# Patient Record
Sex: Female | Born: 1970 | Race: Black or African American | Hispanic: No | Marital: Single | Smoking: Never smoker
Health system: Southern US, Community
[De-identification: ages and names within clinical notes are randomized; demographics above are authoritative.]

## PROBLEM LIST (undated history)

## (undated) DIAGNOSIS — N390 Urinary tract infection, site not specified: Secondary | ICD-10-CM

## (undated) DIAGNOSIS — E785 Hyperlipidemia, unspecified: Secondary | ICD-10-CM

## (undated) DIAGNOSIS — R87619 Unspecified abnormal cytological findings in specimens from cervix uteri: Secondary | ICD-10-CM

## (undated) DIAGNOSIS — D219 Benign neoplasm of connective and other soft tissue, unspecified: Secondary | ICD-10-CM

## (undated) DIAGNOSIS — IMO0002 Reserved for concepts with insufficient information to code with codable children: Secondary | ICD-10-CM

## (undated) HISTORY — PX: COLPOSCOPY: SHX161

## (undated) HISTORY — PX: ESSURE TUBAL LIGATION: SUR464

## (undated) HISTORY — DX: Benign neoplasm of connective and other soft tissue, unspecified: D21.9

## (undated) HISTORY — PX: LEEP: SHX91

---

## 2012-05-16 ENCOUNTER — Inpatient Hospital Stay (HOSPITAL_COMMUNITY): Payer: Self-pay

## 2012-05-16 ENCOUNTER — Inpatient Hospital Stay (HOSPITAL_COMMUNITY)
Admission: AD | Admit: 2012-05-16 | Discharge: 2012-05-16 | Disposition: A | Discharge status: Home / Self Care | Payer: Self-pay | Source: Ambulatory Visit | Attending: Obstetrics & Gynecology | Admitting: Obstetrics & Gynecology

## 2012-05-16 ENCOUNTER — Encounter (HOSPITAL_COMMUNITY): Payer: Self-pay

## 2012-05-16 DIAGNOSIS — D649 Anemia, unspecified: Secondary | ICD-10-CM | POA: Insufficient documentation

## 2012-05-16 DIAGNOSIS — N644 Mastodynia: Secondary | ICD-10-CM | POA: Insufficient documentation

## 2012-05-16 DIAGNOSIS — N63 Unspecified lump in unspecified breast: Secondary | ICD-10-CM | POA: Insufficient documentation

## 2012-05-16 DIAGNOSIS — N6323 Unspecified lump in the left breast, lower outer quadrant: Secondary | ICD-10-CM

## 2012-05-16 DIAGNOSIS — N949 Unspecified condition associated with female genital organs and menstrual cycle: Secondary | ICD-10-CM | POA: Insufficient documentation

## 2012-05-16 DIAGNOSIS — N920 Excessive and frequent menstruation with regular cycle: Secondary | ICD-10-CM | POA: Insufficient documentation

## 2012-05-16 DIAGNOSIS — D259 Leiomyoma of uterus, unspecified: Secondary | ICD-10-CM | POA: Insufficient documentation

## 2012-05-16 DIAGNOSIS — D250 Submucous leiomyoma of uterus: Secondary | ICD-10-CM

## 2012-05-16 HISTORY — DX: Urinary tract infection, site not specified: N39.0

## 2012-05-16 HISTORY — DX: Unspecified abnormal cytological findings in specimens from cervix uteri: R87.619

## 2012-05-16 HISTORY — DX: Reserved for concepts with insufficient information to code with codable children: IMO0002

## 2012-05-16 LAB — CBC
HCT: 27.9 % — ABNORMAL LOW (ref 36.0–46.0)
MCH: 21.2 pg — ABNORMAL LOW (ref 26.0–34.0)
MCV: 73 fL — ABNORMAL LOW (ref 78.0–100.0)
Platelets: 319 10*3/uL (ref 150–400)
RBC: 3.82 MIL/uL — ABNORMAL LOW (ref 3.87–5.11)
WBC: 6.1 10*3/uL (ref 4.0–10.5)

## 2012-05-16 LAB — POCT PREGNANCY, URINE: Preg Test, Ur: NEGATIVE

## 2012-05-16 LAB — WET PREP, GENITAL
Trich, Wet Prep: NONE SEEN
Yeast Wet Prep HPF POC: NONE SEEN

## 2012-05-16 NOTE — MAU Provider Note (Signed)
Chief Complaint: Metrorrhagia and Breast Pain   First Provider Initiated Contact with Patient 05/16/12 1935     SUBJECTIVE HPI: Diania Co is a 41 y.o. who presents to maternity admissions reporting heavy regular menses, vaginal discharge with mucous, and pain under her left breast  She reports a strong family history of breast and cervical cancer, and of uterine fibroids.  She denies current vaginal bleeding, vaginal itching/burning, urinary symptoms, h/a, dizziness, n/v, or fever/chills.  .   No past medical history on file. No past surgical history on file. History   Social History  . Marital Status: Single    Spouse Name: N/A    Number of Children: N/A  . Years of Education: N/A   Occupational History  . Not on file.   Social History Main Topics  . Smoking status: Not on file  . Smokeless tobacco: Not on file  . Alcohol Use: Not on file  . Drug Use: Not on file  . Sexually Active: Not on file   Other Topics Concern  . Not on file   Social History Narrative  . No narrative on file   No current facility-administered medications on file prior to encounter.   No current outpatient prescriptions on file prior to encounter.   Allergies not on file  ROS: Pertinent items in HPI  OBJECTIVE Blood pressure 119/74, pulse 82, temperature 97.8 F (36.6 C), temperature source Oral, resp. rate 18, height 5\' 1"  (1.549 m), weight 69.854 kg (154 lb), last menstrual period 05/01/2012. GENERAL: Well-developed, well-nourished female in no acute distress.  HEENT: Normocephalic HEART: normal rate RESP: normal effort ABDOMEN: Soft, non-tender EXTREMITIES: Nontender, no edema NEURO: Alert and oriented Pelvic exam: Cervix pink, visually closed, without lesion, moderate amount thin yellow discharge, vaginal walls and external genitalia normal Bimanual exam: Cervix 0/long/high, firm, anterior, neg CMT, uterus nontender, enlarged ~10 week size, adnexa without tenderness, enlargement,  or mass  LAB RESULTS  Results for orders placed during the hospital encounter of 05/16/12 (from the past 24 hour(s))  POCT PREGNANCY, URINE     Status: Normal   Collection Time   05/16/12  7:09 PM      Component Value Range   Preg Test, Ur NEGATIVE  NEGATIVE  WET PREP, GENITAL     Status: Abnormal   Collection Time   05/16/12  7:49 PM      Component Value Range   Yeast Wet Prep HPF POC NONE SEEN  NONE SEEN   Trich, Wet Prep NONE SEEN  NONE SEEN   Clue Cells Wet Prep HPF POC FEW (*) NONE SEEN   WBC, Wet Prep HPF POC FEW (*) NONE SEEN  CBC     Status: Abnormal   Collection Time   05/16/12  8:10 PM      Component Value Range   WBC 6.1  4.0 - 10.5 K/uL   RBC 3.82 (*) 3.87 - 5.11 MIL/uL   Hemoglobin 8.1 (*) 12.0 - 15.0 g/dL   HCT 95.6 (*) 21.3 - 08.6 %   MCV 73.0 (*) 78.0 - 100.0 fL   MCH 21.2 (*) 26.0 - 34.0 pg   MCHC 29.0 (*) 30.0 - 36.0 g/dL   RDW 57.8 (*) 46.9 - 62.9 %   Platelets 319  150 - 400 K/uL    Clinical Data: Heavy menses and enlarged uterus. LMP 04/29/2012.  TRANSABDOMINAL AND TRANSVAGINAL ULTRASOUND OF PELVIS  Technique: Both transabdominal and transvaginal ultrasound  examinations of the pelvis were performed. Transabdominal technique  was  performed for global imaging of the pelvis including uterus,  ovaries, adnexal regions, and pelvic cul-de-sac.  It was necessary to proceed with endovaginal exam following the  transabdominal exam to visualize the uterus and ovaries.  Comparison: None  Findings:  Uterus: The uterus is anteverted and measures 9.4 x 6.6 x 6.8 cm.  There are multiple uterine fibroids. A submucosal fibroid in the  anterior upper uterine body measures 3.1 x 2.7 x 2.4 cm. A  submucosal fibroid in the right upper uterine body measures 2.5 x  2.2 x 2.5 cm. Intramural fibroid in the right uterine body  measures 2.8 x 3.0 x 3.3 cm. Intramural right lower uterine  segment fibroid measures 1.3 x 1.2 x 1.7 cm. Intramural left  fundal fibroid measures  1.5 x 1.2 x 1.4 cm.  Endometrium: Some portions of the the endometrium are obscured by  the fibroids. Where visualized the endometrium measures 7 mm in  thickness.  Right ovary: Normal appearance/no adnexal mass. Measures 4.3 x  2.7 x 2.3 cm.  Left ovary: Normal appearance/no adnexal mass. Measures 2.8 x 1.8  x 1.7 cm.  Other findings: No free fluid  IMPRESSION:  1.Enlarged fibroid uterus. Fibroids are described above. Two of  the fibroids are submucosal in position.  2. Ovaries within normal limits.    ASSESSMENT Uterine fibroids Anemia Heavy vaginal bleeding Breast mass  PLAN Outpatient mammogram Pt to go to Gyn clinic to fill out scholarship paperwork Message sent to GYN clinic to schedule an appointment for follow up as HGB is 8 today. Advised to take an over the counter iron supplement and stool softener if needed. Discharge home  Report given to Nolene Bernheim, NP, at 2010  Lisa Leftwich-Kirby Certified Nurse-Midwife 05/16/2012  7:52 PM

## 2012-05-16 NOTE — MAU Note (Signed)
Pain under left breast for 'some months'.  Pat gm had breast cancer. Been having heavy cycles for past 6 months. Heavier and lasting longer. This wk noted blood and mucous for a couple days when wiped,  Has hx of cervical dysplasia.

## 2012-05-17 ENCOUNTER — Other Ambulatory Visit (HOSPITAL_COMMUNITY): Payer: Self-pay | Admitting: Advanced Practice Midwife

## 2012-05-17 DIAGNOSIS — N6323 Unspecified lump in the left breast, lower outer quadrant: Secondary | ICD-10-CM

## 2012-05-17 LAB — GC/CHLAMYDIA PROBE AMP: CT Probe RNA: NEGATIVE

## 2012-05-30 ENCOUNTER — Encounter: Payer: Self-pay | Admitting: Family

## 2012-06-05 ENCOUNTER — Encounter (HOSPITAL_COMMUNITY): Payer: Self-pay | Admitting: *Deleted

## 2012-06-13 ENCOUNTER — Telehealth: Payer: Self-pay

## 2012-06-13 NOTE — Telephone Encounter (Signed)
Pt called and asked if she needed to cancel appt due to her being on her period.   Called pt and informed pt that she does not need to reschedule appt due to menstruation and by that time it should have slowed done since she would have been on period since 06/06/11.  Pt stated "yeah, ok".  Pt did not have any other questions.

## 2012-06-18 ENCOUNTER — Ambulatory Visit (HOSPITAL_COMMUNITY)
Admission: RE | Admit: 2012-06-18 | Discharge: 2012-06-18 | Disposition: A | Discharge status: Home / Self Care | Payer: Self-pay | Source: Ambulatory Visit | Attending: Obstetrics and Gynecology | Admitting: Obstetrics and Gynecology

## 2012-06-18 ENCOUNTER — Encounter (HOSPITAL_COMMUNITY): Payer: Self-pay

## 2012-06-18 ENCOUNTER — Other Ambulatory Visit: Payer: Self-pay | Admitting: Obstetrics and Gynecology

## 2012-06-18 VITALS — BP 102/64 | Temp 98.6°F | Ht 61.0 in | Wt 151.2 lb

## 2012-06-18 DIAGNOSIS — Z01419 Encounter for gynecological examination (general) (routine) without abnormal findings: Secondary | ICD-10-CM

## 2012-06-18 DIAGNOSIS — N6323 Unspecified lump in the left breast, lower outer quadrant: Secondary | ICD-10-CM

## 2012-06-18 HISTORY — DX: Hyperlipidemia, unspecified: E78.5

## 2012-06-18 NOTE — Patient Instructions (Signed)
Taught patient how to perform BSE and gave educational materials to take home. Told patient about free cervical cancer screenings to receive a Pap smear prior to 3 years. Let her know [facility] will cover Pap smears every 3 years unless has a history of abnormal Pap smears. Referred patient to the [facility] for diagnostic mammogram and possible left breast ultrasound. Appointment scheduled for Thursday, June 27, 2012 at 0900. Let patient know they may need her previous mammogram films from [place] that it would be helpful to call them and have them sent to the [facility]. Patient aware of appointment and will be there. Let patient know will follow up with her within the next couple weeks with results by letter or phone. Patient already has an appointment scheduled at the [facility] for fibroids. Appointment Thursday, June 20, 2012. Patient verbalized understanding.

## 2012-06-18 NOTE — Progress Notes (Signed)
Complaints of left breast lump x 2 months that is painful when touches it and patient stated she also feels some shooting pains daily. Patient rated pain at a 6 out of 10 when touches.  Pap Smear:  Completed Pap smear today. Per patient last Pap smear was in 2009 and normal. Per patient had an abnormal Pap smear in 1991 that required a LEEP for follow up in 1992. Patient stated has not had any abnormal Pap smears since LEEP.  No Pap smear results in EPIC.  Physical exam: Breasts Breasts symmetrical. No skin abnormalities bilateral breasts. No nipple retraction bilateral breasts. No nipple discharge bilateral breasts. No lymphadenopathy. No lumps palpated right breast. Palpated lump within the left breast at 5 o'clock 5 cm from the areola. Patient complained of pain and tenderness when palpated left breast lump. Referred patient to the [facility] for diagnostic mammogram and possible left breast ultrasound. Appointment scheduled for Thursday, June 27, 2012 at 0900.         Pelvic/Bimanual   Ext Genitalia No lesions, no swelling and no discharge observed on external genitalia.         Vagina Vagina pink and normal texture. No lesions or discharge observed in vagina.          Cervix Cervix is present. Cervix pink and of normal texture. No discharge observed on cervix.      Uterus Uterus is present and palpable. Uterus is titled to the left and enlarged. Patient stated has uterine fibroids and has a follow up appointment at the [facility] on Thursday, June 20, 2012.      Adnexae Bilateral ovaries present and palpable. No tenderness on palpation.        Rectovaginal No rectal exam completed today since patient had no rectal complaints. No skin abnormalities observed on rectal area.

## 2012-06-20 ENCOUNTER — Ambulatory Visit (INDEPENDENT_AMBULATORY_CARE_PROVIDER_SITE_OTHER): Payer: Self-pay | Admitting: Medical

## 2012-06-20 ENCOUNTER — Encounter: Payer: Self-pay | Admitting: Medical

## 2012-06-20 VITALS — BP 103/70 | HR 80 | Temp 98.8°F | Ht 61.0 in | Wt 152.6 lb

## 2012-06-20 DIAGNOSIS — D259 Leiomyoma of uterus, unspecified: Secondary | ICD-10-CM

## 2012-06-20 DIAGNOSIS — D219 Benign neoplasm of connective and other soft tissue, unspecified: Secondary | ICD-10-CM

## 2012-06-20 DIAGNOSIS — N882 Stricture and stenosis of cervix uteri: Secondary | ICD-10-CM

## 2012-06-20 DIAGNOSIS — N938 Other specified abnormal uterine and vaginal bleeding: Secondary | ICD-10-CM

## 2012-06-20 DIAGNOSIS — Z01812 Encounter for preprocedural laboratory examination: Secondary | ICD-10-CM

## 2012-06-20 DIAGNOSIS — N949 Unspecified condition associated with female genital organs and menstrual cycle: Secondary | ICD-10-CM

## 2012-06-21 ENCOUNTER — Encounter: Payer: Self-pay | Admitting: Medical

## 2012-06-21 DIAGNOSIS — N938 Other specified abnormal uterine and vaginal bleeding: Secondary | ICD-10-CM | POA: Insufficient documentation

## 2012-06-21 DIAGNOSIS — D219 Benign neoplasm of connective and other soft tissue, unspecified: Secondary | ICD-10-CM | POA: Insufficient documentation

## 2012-06-21 MED ORDER — MISOPROSTOL 200 MCG PO TABS: 400.0000 ug | ORAL_TABLET | Freq: Once | ORAL | Status: AC

## 2012-06-21 NOTE — Progress Notes (Signed)
Patient ID: Amber Carlson, female   DOB: June 01, 1970, 42 y.o.   MRN: [id number]  History:  Amber Carlson is a 42 y.o. [id number] who presents to clinic today for evaluation of dysfunctional uterine bleeding. The patient was seen in MAU on 05/16/12 and had an US showing multiple fibroids the largest of which measures 2.8 x 3.0 x 3.3 cm. The patient states that for the past few months she has been bleeding, on average, 10-15 days each month. This past month she bled for 21 days. She is only spotting today. Her last episode of bleeding finished on 06/17/12. The patient states that when she has been bleeding these past few months it has been very heavy bleeding. She is occasionally passing clots. They are about 1-2 cm in size but she has passed some as large as her hand in the past few months. The patient has had some occasional dizziness, weakness and fatigue. She denies lightheadedness. She has never been prescribed any medications to stop her bleeding.  The patient is currently taking PO iron and a stool softener daily.   The following portions of the patient's history were reviewed and updated as appropriate: allergies, current medications, past family history, past medical history, past social history, past surgical history and problem list.  Review of Systems:  Pertinent items are noted in HPI.  Objective:  Physical Exam BP 103/70  Pulse 80  Temp 98.8 F (37.1 C) (Oral)  Ht 5\' 1"  (1.549 m)  Wt 152 lb 9.6 oz (69.219 kg)  BMI 28.83 kg/m2  LMP 06/10/2011 GENERAL: Well-developed, well-nourished female in no acute distress.  HEENT: Normocephalic, atraumatic.  LUNGS: Normal rate.  HEART: Regular rate. ABDOMEN: Soft, nontender, nondistended.  PELVIC: Normal external female genitalia. Vagina is pink and rugated.  Normal discharge. Normal cervix contour. Uterus slightly enlarged. No adnexal mass or tenderness.  EXTREMITIES: No cyanosis, clubbing, or edema.  Labs and Imaging *RADIOLOGY REPORT*     Clinical Data: Heavy menses and enlarged uterus. LMP 04/29/2012.   TRANSABDOMINAL AND TRANSVAGINAL ULTRASOUND OF PELVIS   Technique: Both transabdominal and transvaginal ultrasound  examinations of the pelvis were performed. Transabdominal technique  was performed for global imaging of the pelvis including uterus,  ovaries, adnexal regions, and pelvic cul-de-sac.  It was necessary to proceed with endovaginal exam following the  transabdominal exam to visualize the uterus and ovaries.   Comparison: None   Findings:  Uterus: The uterus is anteverted and measures 9.4 x 6.6 x 6.8 cm.  There are multiple uterine fibroids. A submucosal fibroid in the  anterior upper uterine body measures 3.1 x 2.7 x 2.4 cm. A  submucosal fibroid in the right upper uterine body measures 2.5 x  2.2 x 2.5 cm. Intramural fibroid in the right uterine body  measures 2.8 x 3.0 x 3.3 cm. Intramural right lower uterine  segment fibroid measures 1.3 x 1.2 x 1.7 cm. Intramural left  fundal fibroid measures 1.5 x 1.2 x 1.4 cm.   Endometrium: Some portions of the the endometrium are obscured by  the fibroids. Where visualized the endometrium measures 7 mm in  thickness.   Right ovary: Normal appearance/no adnexal mass. Measures 4.3 x  2.7 x 2.3 cm.   Left ovary: Normal appearance/no adnexal mass. Measures 2.8 x 1.8  x 1.7 cm.   Other findings: No free fluid   IMPRESSION:  1.Enlarged fibroid uterus. Fibroids are described above. Two of  the fibroids are submucosal in position.  2. Ovaries within normal limits.  Original Report Authenticated By: Britta Mccreedy, M.D.  Procedures: ENDOMETRIAL BIOPSY     The indications for endometrial biopsy were reviewed.   Risks of the biopsy including cramping, bleeding, infection, uterine perforation, inadequate specimen and need for additional procedures  were discussed. The patient states she understands and agrees to undergo procedure today. Consent was signed. Time  out was performed. Urine HCG was negative. A sterile speculum was placed in the patient's vagina and the cervix was prepped with Betadine. A single-toothed tenaculum was placed on the anterior lip of the cervix to stabilize it. I attempted to introduce the 3 mm pipelle into the endometrial cavity but was unable to pass through the cervix due to stenosis. Attempt was made to dilate the cervix using the yellow plastic dilator, but this was also unsuccessful. Dr. Jolayne Panther also attempted and was unable to pass through the cervix. The instruments were removed from the patient's vagina. No bleeding from the cervix was noted. The patient will return in the next 1-2 weeks for re-attempt. The patient was prescribed cytotec to be placed the night before the procedure to attempt to soften and dilate the cervix.   Assessment & Plan:  Assessment: Fibroid Uterus Anemia DUB  Plans: Continue on PO iron Patient will return in 1-2 weeks for re-attempt of endometrial biopsy Patient will also need a CBC at that visit Rx for cytotec sent to patient's pharmacy to be placed the night before the procedure Following the endometrial biopsy the patient will be scheduled with a surgeon for follow-up, as surgical intervention may be a possibility to treat her fibroids Patient is encourage to call the office if her condition were to change or worsen or return to MAU for evaluation.  Freddi Starr, PA-C 06/21/2012 9:12 AM

## 2012-06-21 NOTE — Patient Instructions (Addendum)
Fibroids Fibroids are lumps (tumors) that can occur any place in a woman's body. These lumps are not cancerous. Fibroids vary in size, weight, and where they grow. HOME CARE  Do not take aspirin.  Write down the number of pads or tampons you use during your period. Tell your doctor. This can help determine the best treatment for you. GET HELP RIGHT AWAY IF:  You have pain in your lower belly (abdomen) that is not helped with medicine.  You have cramps that are not helped with medicine.  You have more bleeding between or during your period.  You feel lightheaded or pass out (faint).  Your lower belly pain gets worse. MAKE SURE YOU:  Understand these instructions.  Will watch your condition.  Will get help right away if you are not doing well or get worse. Document Released: 06/17/2010 Document Revised: 08/07/2011 Document Reviewed: 06/17/2010 [employer] Patient Information 2013 [employer].  

## 2012-06-26 ENCOUNTER — Encounter: Payer: Self-pay | Admitting: *Deleted

## 2012-06-27 ENCOUNTER — Ambulatory Visit
Admission: RE | Admit: 2012-06-27 | Discharge: 2012-06-27 | Disposition: A | Discharge status: Home / Self Care | Payer: No Typology Code available for payment source | Source: Ambulatory Visit | Attending: Advanced Practice Midwife | Admitting: Advanced Practice Midwife

## 2012-06-27 ENCOUNTER — Ambulatory Visit
Admission: RE | Admit: 2012-06-27 | Discharge: 2012-06-27 | Disposition: A | Discharge status: Home / Self Care | Payer: No Typology Code available for payment source | Source: Ambulatory Visit | Attending: Obstetrics and Gynecology | Admitting: Obstetrics and Gynecology

## 2012-06-27 DIAGNOSIS — N6323 Unspecified lump in the left breast, lower outer quadrant: Secondary | ICD-10-CM

## 2012-07-05 ENCOUNTER — Ambulatory Visit (INDEPENDENT_AMBULATORY_CARE_PROVIDER_SITE_OTHER): Payer: Self-pay | Admitting: Medical

## 2012-07-05 ENCOUNTER — Encounter: Payer: Self-pay | Admitting: Medical

## 2012-07-05 ENCOUNTER — Other Ambulatory Visit (HOSPITAL_COMMUNITY)
Admission: RE | Admit: 2012-07-05 | Discharge: 2012-07-05 | Disposition: A | Discharge status: Home / Self Care | Payer: Self-pay | Source: Ambulatory Visit | Attending: Medical | Admitting: Medical

## 2012-07-05 VITALS — BP 133/80 | HR 79 | Temp 97.2°F | Ht 61.0 in | Wt 151.0 lb

## 2012-07-05 DIAGNOSIS — N949 Unspecified condition associated with female genital organs and menstrual cycle: Secondary | ICD-10-CM | POA: Insufficient documentation

## 2012-07-05 DIAGNOSIS — N938 Other specified abnormal uterine and vaginal bleeding: Secondary | ICD-10-CM | POA: Insufficient documentation

## 2012-07-05 LAB — CBC
Hemoglobin: 10.8 g/dL — ABNORMAL LOW (ref 12.0–15.0)
MCH: 25.2 pg — ABNORMAL LOW (ref 26.0–34.0)
MCHC: 30.9 g/dL (ref 30.0–36.0)
MCV: 81.6 fL (ref 78.0–100.0)
RBC: 4.29 MIL/uL (ref 3.87–5.11)

## 2012-07-05 LAB — POCT PREGNANCY, URINE: Preg Test, Ur: NEGATIVE

## 2012-07-05 MED ORDER — DICLOFENAC SODIUM 75 MG PO TBEC: 75.0000 mg | DELAYED_RELEASE_TABLET | Freq: Two times a day (BID) | ORAL | Status: AC

## 2012-07-05 NOTE — Patient Instructions (Addendum)
Fibroids Fibroids are lumps (tumors) that can occur any place in a woman's body. These lumps are not cancerous. Fibroids vary in size, weight, and where they grow. HOME CARE  Do not take aspirin.  Write down the number of pads or tampons you use during your period. Tell your doctor. This can help determine the best treatment for you. GET HELP RIGHT AWAY IF:  You have pain in your lower belly (abdomen) that is not helped with medicine.  You have cramps that are not helped with medicine.  You have more bleeding between or during your period.  You feel lightheaded or pass out (faint).  Your lower belly pain gets worse. MAKE SURE YOU:  Understand these instructions.  Will watch your condition.  Will get help right away if you are not doing well or get worse. Document Released: 06/17/2010 Document Revised: 08/07/2011 Document Reviewed: 06/17/2010 [facility] Patient Information 2013 [place], [place]. Endometrial Biopsy This is a test in which a tissue sample (a biopsy) is taken from inside the uterus (womb). It is then looked at by a specialist under a microscope to see if the tissue is normal or abnormal. The endometrium is the lining of the uterus. This test helps determine where you are in your menstrual cycle and how hormone levels are affecting the lining of the uterus. Another use for this test is to diagnose endometrial cancer, tuberculosis, polyps, or inflammatory conditions and to evaluate uterine bleeding. PREPARATION FOR TEST No preparation or fasting is necessary. NORMAL FINDINGS No pathologic conditions. Presence of "secretory-type" endometrium 3 to 5 days before to normal menstruation. Ranges for normal findings may vary among different laboratories and hospitals. You should always check with your doctor after having lab work or other tests done to discuss the meaning of your test results and whether your values are considered within normal limits. MEANING OF TEST  Your  caregiver will go over the test results with you and discuss the importance and meaning of your results, as well as treatment options and the need for additional tests if necessary. OBTAINING THE TEST RESULTS It is your responsibility to obtain your test results. Ask the lab or department performing the test when and how you will get your results. Document Released: 09/15/2004 Document Revised: 08/07/2011 Document Reviewed: 04/24/2008 [facility] Patient Information 2013 [place], [place].

## 2012-07-05 NOTE — Progress Notes (Signed)
Patient ID: Amber Carlson, female   DOB: 10/12/1970, 42 y.o.   MRN: [id number]  History:  Ms. Amber Carlson  is a 42 y.o. [id number] who presents to clinic today for endometrial biopsy. The patient was seen previously and had cervical stenosis and the biopsy was unable to be performed. The patient was prescribed cytotec to be taken prior to today's visit. She administered the cytotec at 5:00 am today. She has had cramping. She is also bleeding today. She does not have any other questions or concerns today, but would like a CBC to check her Hgb as she left prior to her lab draw at her last visit. She states that during her periods she does have significant cramping that is no longer controlled by Ibuprofen.    The following portions of the patient's history were reviewed and updated as appropriate: allergies, current medications, past family history, past medical history, past social history, past surgical history and problem list.  Review of Systems:  Pertinent items are noted in HPI.  Objective:  Physical Exam BP 133/80  Pulse 79  Temp 97.2 F (36.2 C) (Oral)  Ht 5\' 1"  (1.549 m)  Wt 151 lb (68.493 kg)  BMI 28.53 kg/m2  LMP 07/03/2011 GENERAL: Well-developed, well-nourished female in no acute distress.  HEENT: Normocephalic, atraumatic.  LUNGS: Normal rate. HEART: Regular rate. ABDOMEN: Soft, nontender, nondistended. No organomegaly. Normal bowel sounds appreciated in all quadrants.  PELVIC: Normal external female genitalia. Vagina is pink and rugated.  Normal discharge. Moderate amount of bleeding in the vaginal vault. Irregular cervix contour, patient has had prior colpo and LEEP. Uterus is normal in size. No adnexal mass or tenderness.  EXTREMITIES: No cyanosis, clubbing, or edema.  ENDOMETRIAL BIOPSY     The indications for endometrial biopsy were reviewed.   Risks of the biopsy including cramping, bleeding, infection, uterine perforation, inadequate specimen and need for additional  procedures  were discussed. The patient states she understands and agrees to undergo procedure today. Consent was signed. Time out was performed. Urine HCG was negative. A sterile speculum was placed in the patient's vagina and the cervix was prepped with Betadine. A single-toothed tenaculum was placed on the anterior lip of the cervix to stabilize it. The 3 mm pipelle was introduced into the endometrial cavity without difficulty to a depth of 45m, and a moderate amount of tissue was obtained and sent to pathology. The instruments were removed from the patient's vagina. Minimal bleeding from the cervix was noted. The patient tolerated the procedure well. Routine post-procedure instructions were given to the patient. The patient will be called with the results and given recommendations for further management.     Assessment & Plan:  Assessment: DUB Fibroids  Plans: 1. Return in 2 weeks to discuss results and treatment options with MD 2. Rx for diclofenac sent to patient's pharmacy 3. CBC obtained today. Will contact patient with results if a change in Iron supplementation is required.  Freddi Starr, PA-C 07/05/2012 10:17 AM

## 2012-07-08 ENCOUNTER — Telehealth: Payer: Self-pay | Admitting: General Practice

## 2012-07-08 NOTE — Telephone Encounter (Signed)
Called patient, no answer- left message to give [place] a call back at the clinics

## 2012-07-08 NOTE — Telephone Encounter (Signed)
Message copied by Kathee Delton on Mon Jul 08, 2012 11:53 AM ------      Message from: Freddi Starr      Created: Fri Jul 05, 2012  7:50 PM       Please inform patient that Hgb has improved to 10.8. She should keep taking the iron supplement for now. ------

## 2012-07-09 NOTE — Telephone Encounter (Signed)
Pt left message stating that she is returning our call. I called her back and left a message that her Hgb has improved although not completely normal. She needs to continue taking the iron supplement. She may call back if she has additional questions.

## 2012-07-16 ENCOUNTER — Encounter: Payer: Self-pay | Admitting: *Deleted

## 2012-07-19 ENCOUNTER — Ambulatory Visit (INDEPENDENT_AMBULATORY_CARE_PROVIDER_SITE_OTHER): Payer: Self-pay | Admitting: Obstetrics & Gynecology

## 2012-07-19 ENCOUNTER — Encounter: Payer: Self-pay | Admitting: Obstetrics & Gynecology

## 2012-07-19 VITALS — BP 104/74 | HR 71 | Temp 98.7°F | Wt 154.0 lb

## 2012-07-19 DIAGNOSIS — N949 Unspecified condition associated with female genital organs and menstrual cycle: Secondary | ICD-10-CM

## 2012-07-19 DIAGNOSIS — D259 Leiomyoma of uterus, unspecified: Secondary | ICD-10-CM

## 2012-07-19 MED ORDER — NORGESTIMATE-ETH ESTRADIOL 0.25-35 MG-MCG PO TABS: 1.0000 | ORAL_TABLET | Freq: Every day | ORAL | Status: DC

## 2012-07-19 NOTE — Progress Notes (Signed)
Subjective:     Patient ID: Amber Carlson, female   DOB: 06-12-1970, 42 y.o.   MRN: [id number]  HPI Pt c/o a long h/o abnormal uterine bleeding.  She has had no bleeding since her last visit.  Is on no treatment for the bleeding.  She is here to get her endo bx results and discuss treatment options for her abnormal bleeding.   Review of Systems     Objective:   Physical Exam BP 104/74  Pulse 71  Temp(Src) 98.7 F (37.1 C) (Oral)  Wt 154 lb (69.854 kg)  BMI 29.11 kg/m2  LMP 07/03/2011 Pt in NAD Exam deferred CBC    Component Value Date/Time   WBC 5.7 07/05/2012 1043   RBC 4.29 07/05/2012 1043   HGB 10.8* 07/05/2012 1043   HCT 35.0* 07/05/2012 1043   PLT 299 07/05/2012 1043   MCV 81.6 07/05/2012 1043   MCH 25.2* 07/05/2012 1043   MCHC 30.9 07/05/2012 1043   RDW 20.3* 07/05/2012 1043  06/27/12 mammogram Findings:  ACR Breast Density Category heterogeneously dense  CC and MLO views of bilateral breasts, spot compression right CC  and MLO view, spot compression left tangential view are submitted.  No suspicious abnormality is identified in both breasts.  Mammographic images were processed with CAD.  Ultrasound is performed, showing no focal discrete cystic or solid  lesion at the left breast five o'clock 20 cm from nipple area of  pain.  IMPRESSION:  Negative.  RECOMMENDATION:  Routine screening mammogram in 1 year.  I have discussed the findings and recommendations with the patient.  Results were also provided in writing at the conclusion of the  visit.  BI-RADS CATEGORY 1: Negative.    07/05/12 surg path: Diagnosis Endometrium, biopsy - BENIGN FRAGMENTS OF ENDOMETRIUM WITH BREAKDOWN. - NEGATIVE FOR HYPERPLASIA OR MALIGNANCY.     Assessment:     DUB/fibroids/menorrhagia- d/w pt treatment options including OCP's.  Pt had a lump in her breast which on eval was benign (see results above) Reviewed with pt bx results and natural hx of fibroids       Plan:     Sprintec 1 po q  day F/u 3 months or sooner prn Cont [id number]

## 2012-07-19 NOTE — Patient Instructions (Signed)
Uterine Fibroid A uterine fibroid is a growth (tumor) that occurs in a woman's uterus. This type of tumor is not cancerous and does not spread out of the uterus. A woman can have one or many fibroids, and the fiboid(s) can become quite large. A fibroid can vary in size, weight, and where it grows in the uterus. Most fibroids do not require medical treatment, but some can cause pain or heavy bleeding during and between periods. CAUSES  A fibroid is the result of a single uterine cell that keeps growing (unregulated), which is different than most cells in the human body. Most cells have a control mechanism that keeps them from reproducing without control.  SYMPTOMS   Bleeding.  Pelvic pain and pressure.  Bladder problems due to the size of the fibroid.  Infertility and miscarriages depending on the size and location of the fibroid. DIAGNOSIS  A diagnosis is made by physical exam. Your caregiver may feel the lumpy tumors during a pelvic exam. Important information regarding size, location, and number of tumors can be gained by having an ultrasound. It is rare that other tests, such as a CT scan or MRI, are needed. TREATMENT   Your caregiver may recommend watchful waiting. This involves getting the fibroid checked by your caregiver to see if the fibroids grow or shrink.   Hormonal treatment or an intrauterine device (IUD) may be prescribed.   Surgery may be needed to remove the fibroids (myomectomy) or the uterus (hysterectomy). This depends on your situation. When fibroids interfere with fertility and a woman wants to become pregnant, a caregiver may recommend having the fibroids removed.  HOME CARE INSTRUCTIONS  Home care depends on how you were treated. In general:   Keep all follow-up appointments with your caregiver.   Only take medicine as told by your caregiver. Do not take aspirin. It can cause bleeding.   If you have excessive periods and soak tampons or pads in a half hour or  less, contact your caregiver immediately. If your periods are troublesome but not so heavy, lie down with your feet raised slightly above your heart. Place cold packs on your lower abdomen.   If your periods are heavy, write down the number of pads or tampons you use per month. Bring this information to your caregiver.   Talk to your caregiver about taking iron pills.   Include green vegetables in your diet.   If you were prescribed a hormonal treatment, take the hormonal medicines as directed.   If you need surgery, ask your caregiver for information on your specific surgery.  SEEK IMMEDIATE MEDICAL CARE IF:  You have pelvic pain or cramps not controlled with medicines.   You have a sudden increase in pelvic pain.   You have an increase of bleeding between and during periods.   You feel lightheaded or have fainting episodes.  MAKE SURE YOU:  Understand these instructions.  Will watch your condition.  Will get help right away if you are not doing well or get worse. Document Released: 05/12/2000 Document Revised: 08/07/2011 Document Reviewed: 06/05/2011 [facility] Patient Information 2013 [place], [place]. Dysfunctional Uterine Bleeding Normally, menstrual periods begin between ages 41 to 61 in young women. A normal menstrual cycle/period may begin every 23 days up to 35 days and lasts from 1 to 7 days. Around 12 to 14 days before your menstrual period starts, ovulation (ovary produces an egg) occurs. When counting the time between menstrual periods, count from the first day of bleeding of  the previous period to the first day of bleeding of the next period. Dysfunctional (abnormal) uterine bleeding is bleeding that is different from a normal menstrual period. Your periods may come earlier or later than usual. They may be lighter, have blood clots or be heavier. You may have bleeding between periods, or you may skip one period or more. You may have bleeding after sexual  intercourse, bleeding after menopause, or no menstrual period. CAUSES   Pregnancy (normal, miscarriage, tubal).  IUDs (intrauterine device, birth control).  Birth control pills.  Hormone treatment.  Menopause.  Infection of the cervix.  Blood clotting problems.  Infection of the inside lining of the uterus.  Endometriosis, inside lining of the uterus growing in the pelvis and other female organs.  Adhesions (scar tissue) inside the uterus.  Obesity or severe weight loss.  Uterine polyps inside the uterus.  Cancer of the vagina, cervix, or uterus.  Ovarian cysts or polycystic ovary syndrome.  Medical problems (diabetes, thyroid disease).  Uterine fibroids (noncancerous tumor).  Problems with your female hormones.  Endometrial hyperplasia, very thick lining and enlarged cells inside of the uterus.  Medicines that interfere with ovulation.  Radiation to the pelvis or abdomen.  Chemotherapy. DIAGNOSIS   Your doctor will discuss the history of your menstrual periods, medicines you are taking, changes in your weight, stress in your life, and any medical problems you may have.  Your doctor will do a physical and pelvic examination.  Your doctor may want to perform certain tests to make a diagnosis, such as:  Pap test.  Blood tests.  Cultures for infection.  CT scan.  Ultrasound.  Hysteroscopy.  Laparoscopy.  MRI.  Hysterosalpingography.  D and C.  Endometrial biopsy. TREATMENT  Treatment will depend on the cause of the dysfunctional uterine bleeding (DUB). Treatment may include:  Observing your menstrual periods for a couple of months.  Prescribing medicines for medical problems, including:  Antibiotics.  Hormones.  Birth control pills.  Removing an IUD (intrauterine device, birth control).  Surgery:  D and C (scrape and remove tissue from inside the uterus).  Laparoscopy (examine inside the abdomen with a lighted tube).  Uterine  ablation (destroy lining of the uterus with electrical current, laser, heat, or freezing).  Hysteroscopy (examine cervix and uterus with a lighted tube).  Hysterectomy (remove the uterus). HOME CARE INSTRUCTIONS   If medicines were prescribed, take exactly as directed. Do not change or switch medicines without consulting your caregiver.  Long term heavy bleeding may result in iron deficiency. Your caregiver may have prescribed iron pills. They help replace the iron that your body lost from heavy bleeding. Take exactly as directed.  Do not take aspirin or medicines that contain aspirin one week before or during your menstrual period. Aspirin may make the bleeding worse.  If you need to change your sanitary pad or tampon more than once every 2 hours, stay in bed with your feet elevated and a cold pack on your lower abdomen. Rest as much as possible, until the bleeding stops or slows down.  Eat well-balanced meals. Eat foods high in iron. Examples are:  Leafy green vegetables.  Whole-grain breads and cereals.  Eggs.  Meat.  Liver.  Do not try to lose weight until the abnormal bleeding has stopped and your blood iron level is back to normal. Do not lift more than ten pounds or do strenuous activities when you are bleeding.  For a couple of months, make note on your calendar, marking  the start and ending of your period, and the type of bleeding (light, medium, heavy, spotting, clots or missed periods). This is for your caregiver to better evaluate your problem. SEEK MEDICAL CARE IF:   You develop nausea (feeling sick to your stomach) and vomiting, dizziness, or diarrhea while you are taking your medicine.  You are getting lightheaded or weak.  You have any problems that may be related to the medicine you are taking.  You develop pain with your DUB.  You want to remove your IUD.  You want to stop or change your birth control pills or hormones.  You have any type of abnormal  bleeding mentioned above.  You are over 19 years old and have not had a menstrual period yet.  You are 42 years old and you are still having menstrual periods.  You have any of the symptoms mentioned above.  You develop a rash. SEEK IMMEDIATE MEDICAL CARE IF:   An oral temperature above 102 F (38.9 C) develops.  You develop chills.  You are changing your sanitary pad or tampon more than once an hour.  You develop abdominal pain.  You pass out or faint. Document Released: 05/12/2000 Document Revised: 08/07/2011 Document Reviewed: 04/13/2009 [facility] Patient Information 2013 [place], [place].

## 2012-07-22 ENCOUNTER — Encounter (HOSPITAL_COMMUNITY): Payer: Self-pay | Admitting: *Deleted

## 2012-07-26 ENCOUNTER — Telehealth (HOSPITAL_COMMUNITY): Payer: Self-pay | Admitting: *Deleted

## 2012-07-26 NOTE — Telephone Encounter (Signed)
Telephoned patient at home # and left message to return call to [facility] 

## 2012-09-30 ENCOUNTER — Emergency Department (INDEPENDENT_AMBULATORY_CARE_PROVIDER_SITE_OTHER)
Admission: EM | Admit: 2012-09-30 | Discharge: 2012-09-30 | Disposition: A | Discharge status: Home / Self Care | Payer: Self-pay | Source: Home / Self Care | Attending: Emergency Medicine | Admitting: Emergency Medicine

## 2012-09-30 ENCOUNTER — Encounter (HOSPITAL_COMMUNITY): Payer: Self-pay | Admitting: Emergency Medicine

## 2012-09-30 DIAGNOSIS — J309 Allergic rhinitis, unspecified: Secondary | ICD-10-CM

## 2012-09-30 MED ORDER — FEXOFENADINE-PSEUDOEPHED ER 60-120 MG PO TB12: 1.0000 | ORAL_TABLET | Freq: Two times a day (BID) | ORAL | Status: AC

## 2012-09-30 MED ORDER — METHYLPREDNISOLONE ACETATE 40 MG/ML IJ SUSP
INTRAMUSCULAR | Status: AC
  Filled 2012-09-30: qty 10

## 2012-09-30 MED ORDER — AMOXICILLIN 500 MG PO CAPS: 1000.0000 mg | ORAL_CAPSULE | Freq: Three times a day (TID) | ORAL | Status: AC

## 2012-09-30 MED ORDER — METHYLPREDNISOLONE ACETATE 80 MG/ML IJ SUSP
80.0000 mg | Freq: Once | INTRAMUSCULAR | Status: AC
  Administered 2012-09-30: 80 mg via INTRAMUSCULAR

## 2012-09-30 MED ORDER — PREDNISONE 10 MG PO TABS: ORAL_TABLET | ORAL | Status: AC

## 2012-09-30 MED ORDER — FLUTICASONE PROPIONATE 50 MCG/ACT NA SUSP: 2.0000 | Freq: Every day | NASAL | Status: AC

## 2012-09-30 NOTE — ED Notes (Signed)
Pt c/o cold/allergy sx onset last year in July when she moved from [place] Current sx include: nasal congestion, itchy/watery eyes, nauseas Denies: f/v/d Taking OTC cold and sinus meds w/little relief  She is alert and oriented w/no signs of acute distress.

## 2012-09-30 NOTE — ED Provider Notes (Signed)
Chief Complaint:   Chief Complaint  Patient presents with  . URI    History of Present Illness:   Amber Carlson is a 42 year old female who has had a one-year history of nasal congestion with yellowish sometimes bloody drainage, sneezing, nasal itching, and itchy, watery eyes. She's had some postnasal drip and a slight cough, but denies any sore throat, fever, or chills. She has intermittent headaches and sinus pressure. Symptoms are worse in the spring and fall but are year-round. They seem to get worse when she moved here from [place] although she did have some symptoms in [place]. She states that when she lived in [place] the symptoms were at their worst.  Review of Systems:  Other than noted above, the patient denies any of the following symptoms. Systemic:  No fever, chills, sweats, fatigue, myalgias, headache, or anorexia. Eye:  No redness, itching, watering, pain or drainage. ENT:  No earache, ear congestion, nasal congestion, sneezing, nasal itching, rhinorrhea, sinus pressure, sinus pain, post nasal drip, or sore throat. Lungs:  No cough, sputum production, wheezing, shortness of breath, or chest pain. Skin:  No rash or itching.  PMFSH:  Past medical history, family history, social history, meds, and allergies were reviewed.  No history of asthma. No use of tobacco.   Physical Exam:   Vital signs:  BP 146/84  Pulse 72  Temp(Src) 98.7 F (37.1 C) (Oral)  Resp 16  SpO2 100%  LMP 09/23/2012 General:  Alert, in no distress. Eye:  No conjunctival injection or drainage. Lids were normal. ENT:  TMs and canals were normal, without erythema or inflammation.  Nasal mucosa was congested, pale and boggy with clear drainage drainage.  Mucous membranes were moist.  Pharynx was clear, without exudate or drainage.  There were no oral ulcerations or lesions. Neck:  Supple, no adenopathy, tenderness or mass. Lungs:  No respiratory distress.  Lungs were clear to auscultation, without wheezes,  rales or rhonchi.  Breath sounds were clear and equal bilaterally. Heart:  Regular rhythm, without gallops, murmers or rubs. Skin:  Clear, warm, and dry, without rash or lesions.  Course in Urgent Care Center:   Given Depo-Medrol 80 mg IM.  Assessment:  The encounter diagnosis was Allergic rhinitis.  Appears to have both seasonal and perennial allergic rhinitis. I believe she would benefit from consulting an allergist.  Plan:   1.  The following meds were prescribed:   Discharge Medication List as of 09/30/2012  7:06 PM    START taking these medications   Details  amoxicillin (AMOXIL) 500 MG capsule Take 2 capsules (1,000 mg total) by mouth 3 (three) times daily., Starting 09/30/2012, Until Discontinued, Normal    fexofenadine-pseudoephedrine (ALLEGRA-D) 60-120 MG per tablet Take 1 tablet by mouth every 12 (twelve) hours., Starting 09/30/2012, Until Discontinued, Normal    fluticasone (FLONASE) 50 MCG/ACT nasal spray Place 2 sprays into the nose daily., Starting 09/30/2012, Until Discontinued, Normal    predniSONE (DELTASONE) 10 MG tablet Take 4 tabs daily for 4 days, 3 tabs daily for 4 days, 2 tabs daily for 4 days, then 1 tab daily for 4 days., Normal       2.  The patient was instructed in symptomatic care and handouts were given. 3.  The patient was told to return if becoming worse in any way, if no better in 3 or 4 days, and given some red flag symptoms such as fever, worsening pain, or difficulty breathing that would indicate earlier return. 4.  The  patient was also instructed in allergen avoidance. 5.  Follow up with Dr. Lenn Cal next week.      Reuben Likes, MD 09/30/12 [id number]

## 2013-06-30 ENCOUNTER — Telehealth: Payer: Self-pay | Admitting: General Practice

## 2013-06-30 DIAGNOSIS — N938 Other specified abnormal uterine and vaginal bleeding: Secondary | ICD-10-CM

## 2013-06-30 DIAGNOSIS — D219 Benign neoplasm of connective and other soft tissue, unspecified: Secondary | ICD-10-CM

## 2013-06-30 MED ORDER — NORGESTIMATE-ETH ESTRADIOL 0.25-35 MG-MCG PO TABS: 1.0000 | ORAL_TABLET | Freq: Every day | ORAL | Status: DC

## 2013-06-30 NOTE — Telephone Encounter (Signed)
Patient called and left message stating she went to her pharmacy on Saturday to pickup her medication but was told she had no refills and would like a refill for her medicine. Spoke to dr Ihor Dow who authorized for one month supply till patient could get here for an appt. Called patient and notified her of one month supply to last her till her followup/annual visit with [place] & told her our front office staff will contact her with an appt. Patient was satisfied and gave her gratitude for the refill and had no further questions

## 2013-07-24 ENCOUNTER — Ambulatory Visit: Payer: Self-pay | Admitting: Obstetrics & Gynecology

## 2013-07-24 ENCOUNTER — Other Ambulatory Visit: Payer: Self-pay

## 2013-07-24 DIAGNOSIS — N938 Other specified abnormal uterine and vaginal bleeding: Secondary | ICD-10-CM

## 2013-07-24 DIAGNOSIS — D219 Benign neoplasm of connective and other soft tissue, unspecified: Secondary | ICD-10-CM

## 2013-07-24 MED ORDER — NORGESTIMATE-ETH ESTRADIOL 0.25-35 MG-MCG PO TABS
1.0000 | ORAL_TABLET | Freq: Every day | ORAL | Status: DC
Start: 2013-07-24 — End: 2013-07-31

## 2013-07-31 ENCOUNTER — Ambulatory Visit (INDEPENDENT_AMBULATORY_CARE_PROVIDER_SITE_OTHER): Payer: No Typology Code available for payment source | Admitting: Obstetrics & Gynecology

## 2013-07-31 ENCOUNTER — Encounter: Payer: Self-pay | Admitting: Obstetrics & Gynecology

## 2013-07-31 VITALS — BP 136/75 | HR 77 | Temp 97.7°F | Ht 63.0 in | Wt 151.1 lb

## 2013-07-31 DIAGNOSIS — N949 Unspecified condition associated with female genital organs and menstrual cycle: Secondary | ICD-10-CM

## 2013-07-31 DIAGNOSIS — Z113 Encounter for screening for infections with a predominantly sexual mode of transmission: Secondary | ICD-10-CM

## 2013-07-31 DIAGNOSIS — D259 Leiomyoma of uterus, unspecified: Secondary | ICD-10-CM

## 2013-07-31 DIAGNOSIS — D649 Anemia, unspecified: Secondary | ICD-10-CM

## 2013-07-31 DIAGNOSIS — D219 Benign neoplasm of connective and other soft tissue, unspecified: Secondary | ICD-10-CM

## 2013-07-31 DIAGNOSIS — N925 Other specified irregular menstruation: Secondary | ICD-10-CM

## 2013-07-31 DIAGNOSIS — N938 Other specified abnormal uterine and vaginal bleeding: Secondary | ICD-10-CM

## 2013-07-31 LAB — CBC
HCT: 27.8 % — ABNORMAL LOW (ref 36.0–46.0)
Hemoglobin: 8.1 g/dL — ABNORMAL LOW (ref 12.0–15.0)
MCH: 19.2 pg — ABNORMAL LOW (ref 26.0–34.0)
MCHC: 29.1 g/dL — ABNORMAL LOW (ref 30.0–36.0)
MCV: 65.9 fL — ABNORMAL LOW (ref 78.0–100.0)
PLATELETS: 494 10*3/uL — AB (ref 150–400)
RBC: 4.22 MIL/uL (ref 3.87–5.11)
RDW: 19.2 % — ABNORMAL HIGH (ref 11.5–15.5)
WBC: 5.6 10*3/uL (ref 4.0–10.5)

## 2013-07-31 LAB — HIV ANTIBODY (ROUTINE TESTING W REFLEX): HIV: NONREACTIVE

## 2013-07-31 MED ORDER — NORGESTIMATE-ETH ESTRADIOL 0.25-35 MG-MCG PO TABS: 1.0000 | ORAL_TABLET | Freq: Every day | ORAL | Status: AC

## 2013-07-31 NOTE — Progress Notes (Signed)
Subjective:     Patient ID: Amber Carlson, female   DOB: [birth year], 43 y.o.   MRN: [id number]  HPI Pt presents with complaints of bleeding when she misses pills only.  Wanted a refill on her OCP's but, was told that she needed a visit first.  She has had an ESSURE and is happy staying on the  [place]. 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

## 2013-07-31 NOTE — Patient Instructions (Signed)
Menorrhagia  Menorrhagia is the medical term for when your menstrual periods are heavy or last longer than usual. With menorrhagia, every period you have may cause enough blood loss and cramping that you are unable to maintain your usual activities.  CAUSES   In some cases, the cause of heavy periods is unknown, but a number of conditions may cause menorrhagia. Common causes include:  · A problem with the hormone-producing thyroid gland (hypothyroid).  · Noncancerous growths in the uterus (polyps or fibroids).  · An imbalance of the estrogen and progesterone hormones.  · One of your ovaries not releasing an egg during one or more months.  · Side effects of having an intrauterine device (IUD).  · Side effects of some medicines, such as anti-inflammatory medicines or blood thinners.  · A bleeding disorder that stops your blood from clotting normally.  SIGNS AND SYMPTOMS   During a normal period, bleeding lasts between 4 and 8 days. Signs that your periods are too heavy include:  · You routinely have to change your pad or tampon every 1 or 2 hours because it is completely soaked.  · You pass blood clots larger than 1 inch (2.5 cm) in size.  · You have bleeding for more than 7 days.  · You need to use pads and tampons at the same time because of heavy bleeding.  · You need to wake up to change your pads or tampons during the night.  · You have symptoms of anemia, such as tiredness, fatigue, or shortness of breath.   DIAGNOSIS   Your health care provider will perform a physical exam and ask you questions about your symptoms and menstrual history. Other tests may be ordered based on what the health care provider finds during the exam. These tests can include:  · Blood tests To check if you are pregnant or have hormonal changes, a bleeding or thyroid disorder, low iron levels (anemia), or other problems.  · Endometrial biopsy Your health care provider takes a sample of tissue from the inside of your uterus to be examined  under a microscope.  · Pelvic ultrasound This test uses sound waves to make a picture of your uterus, ovaries, and vagina. The pictures can show if you have fibroids or other growths.  · Hysteroscopy For this test, your health care provider will use a small telescope to look inside your uterus.  Based on the results of your initial tests, your health care provider may recommend further testing.  TREATMENT   Treatment may not be needed. If it is needed, your health care provider may recommend treatment with one or more medicines first. If these do not reduce bleeding enough, a surgical treatment might be an option. The best treatment for you will depend on:   · Whether you need to prevent pregnancy.    · Your desire to have children in the future.  · The cause and severity of your bleeding.  · Your opinion and personal preference.    Medicines for menorrhagia may include:  · Birth control methods that use hormones These include the pill, skin patch, vaginal ring, shots that you get every 3 months, hormonal IUD, and implant. These treatments reduce bleeding during your menstrual period.  · Medicines that thicken blood and slow bleeding.  · Medicines that reduce swelling, such as ibuprofen.   · Medicines that contain a synthetic hormone called progestin.    · Medicines that make the ovaries stop working for a short time.    You may need surgical   treatment for menorrhagia if the medicines are unsuccessful. Treatment options include:  · Dilation and curettage (D&C) In this procedure, your health care provider opens (dilates) your cervix and then scrapes or suctions tissue from the lining of your uterus to reduce menstrual bleeding.  · Operative hysteroscopy This procedure uses a tiny tube with a light (hysteroscope) to view your uterine cavity and can help in the surgical removal of a polyp that may be causing heavy periods.  · Endometrial ablation Through various techniques, your health care provider permanently  destroys the entire lining of your uterus (endometrium). After endometrial ablation, most women have little or no menstrual flow. Endometrial ablation reduces your ability to become pregnant.  · Endometrial resection This surgical procedure uses an electrosurgical wire loop to remove the lining of the uterus. This procedure also reduces your ability to become pregnant.  · Hysterectomy Surgical removal of the uterus and cervix is a permanent procedure that stops menstrual periods. Pregnancy is not possible after a hysterectomy. This procedure requires anesthesia and hospitalization.  HOME CARE INSTRUCTIONS   · Only take over-the-counter or prescription medicines as directed by your health care provider. Take prescribed medicines exactly as directed. Do not change or switch medicines without consulting your health care provider.  · Take any prescribed iron pills exactly as directed by your health care provider. Long-term heavy bleeding may result in low iron levels. Iron pills help replace the iron your body lost from heavy bleeding. Iron may cause constipation. If this becomes a problem, increase the bran, fruits, and roughage in your diet.  · Do not take aspirin or medicines that contain aspirin 1 week before or during your menstrual period. Aspirin may make the bleeding worse.  · If you need to change your sanitary pad or tampon more than once every 2 hours, stay in bed and rest as much as possible until the bleeding stops.  · Eat well-balanced meals. Eat foods high in iron. Examples are leafy green vegetables, meat, liver, eggs, and whole grain breads and cereals. Do not try to lose weight until the abnormal bleeding has stopped and your blood iron level is back to normal.  SEEK MEDICAL CARE IF:   · You soak through a pad or tampon every 1 or 2 hours, and this happens every time you have a period.  · You need to use pads and tampons at the same time because you are bleeding so much.  · You need to change your pad  or tampon during the night.  · You have a period that lasts for more than 8 days.  · You pass clots bigger than 1 inch wide.  · You have irregular periods that happen more or less often than once a month.  · You feel dizzy or faint.  · You feel very weak or tired.  · You feel short of breath or feel your heart is beating too fast when you exercise.  · You have nausea and vomiting or diarrhea while you are taking your medicine.  · You have any problems that may be related to the medicine you are taking.  SEEK IMMEDIATE MEDICAL CARE IF:   · You soak through 4 or more pads or tampons in 2 hours.  · You have any bleeding while you are pregnant.  MAKE SURE YOU:   · Understand these instructions.  · Will watch your condition.  · Will get help right away if you are not doing well or get worse.    Document Released: 05/15/2005 Document Revised: 03/05/2013 Document Reviewed: 11/03/2012  [employer]® Patient Information ©2014 [employer].

## 2013-08-04 ENCOUNTER — Telehealth: Payer: Self-pay | Admitting: *Deleted

## 2013-08-04 NOTE — Telephone Encounter (Signed)
Message copied by Mitchell Heir on Mon Aug 04, 2013 11:09 AM ------      Message from: Lavonia Drafts      Created: Sat Aug 02, 2013 12:20 AM       Please call pt.  She needs to take FeSO4 bid.  Please confirm that she has begun the [place] as directed.            Thx            [id number]-[id number]  ------

## 2013-08-04 NOTE — Telephone Encounter (Addendum)
Called patient and left message for patient to call us back.  12:07  Pt returned call from [place] and stated that we may leave a message. I called pt and was able to speak with her. She confirmed that she is taking the Sprintec daily as directed. I advised her of need to take FeS04 twice daily due to low hemoglobin count. Pt voiced understanding of all information and instructions

## 2013-11-20 IMAGING — US US OB TRANSVAGINAL
1 series · 13 of 28 positions shown · non-contrast
Comparison: None

CLINICAL DATA: Heavy menses and enlarged uterus.  LMP 04/29/2012.



[Series 1: us pelvis complete · 13 of 64 slices shown]
[im 3/64]
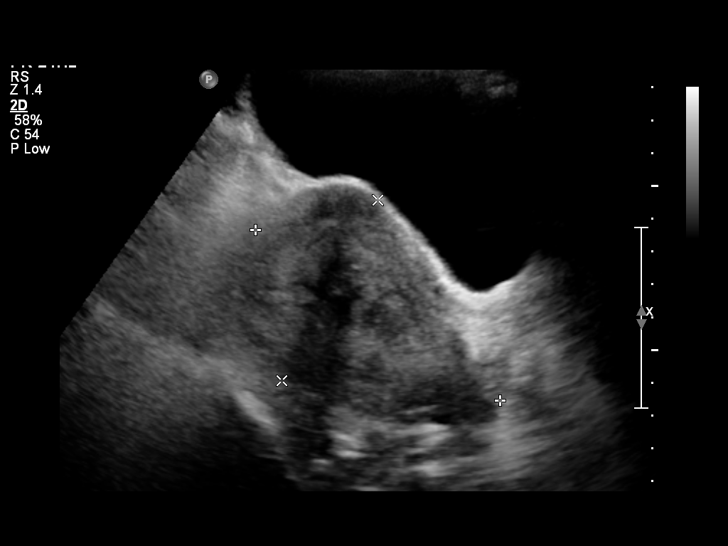
[im 8/64]
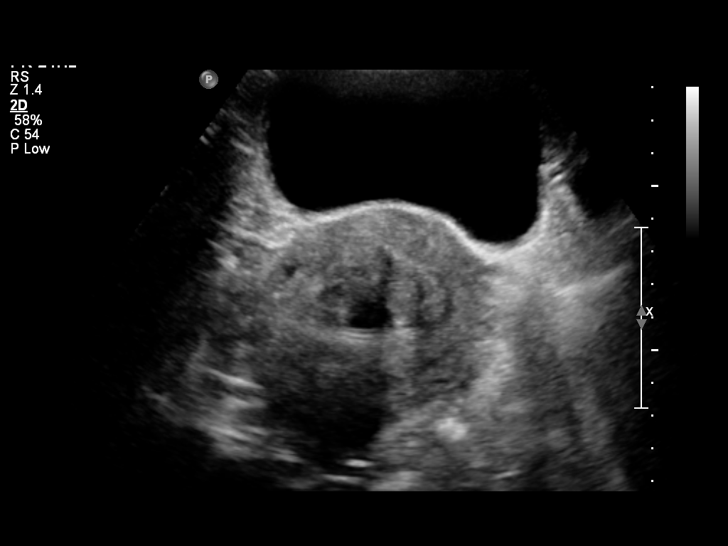
[im 12/64]
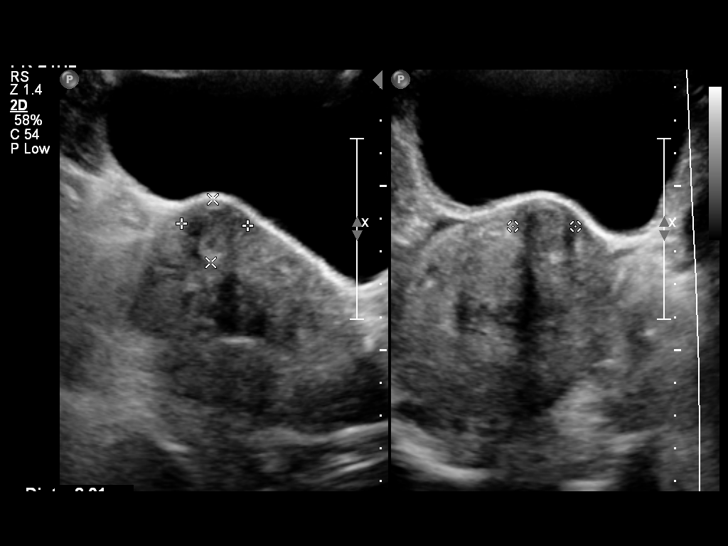
[im 17/64]
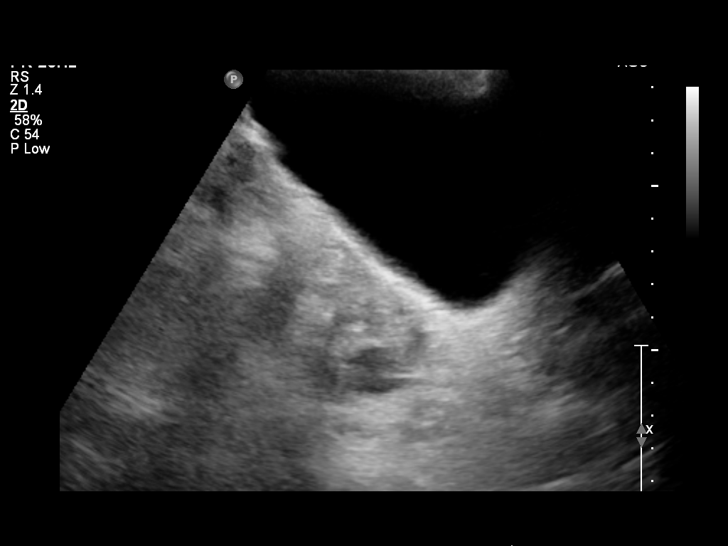
[im 22/64]
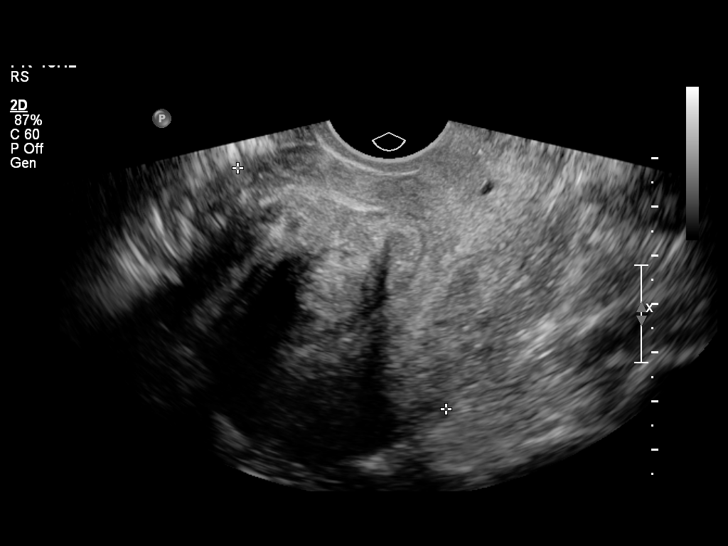
[im 26/64]
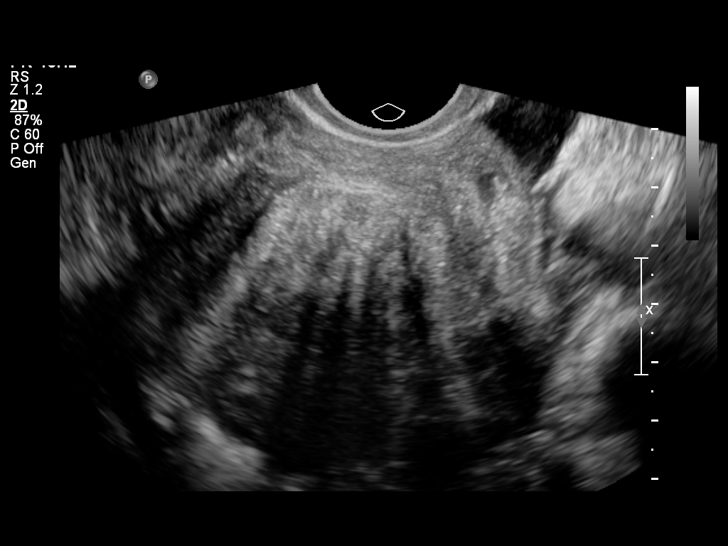
[im 33/64]
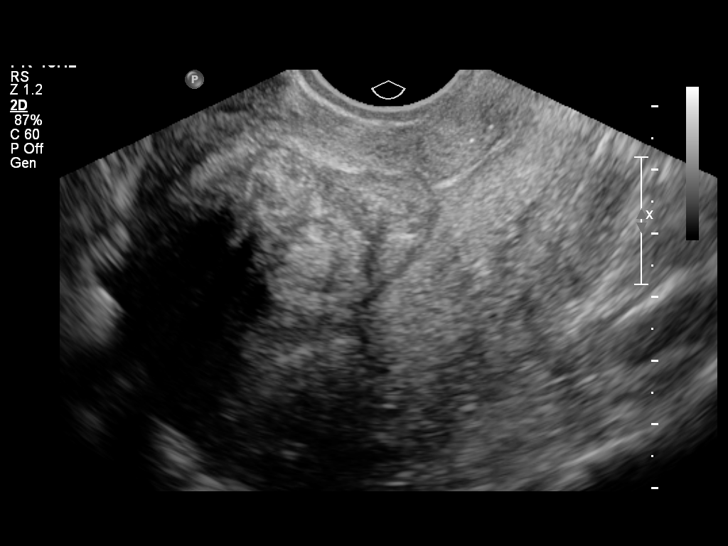
[im 38/64]
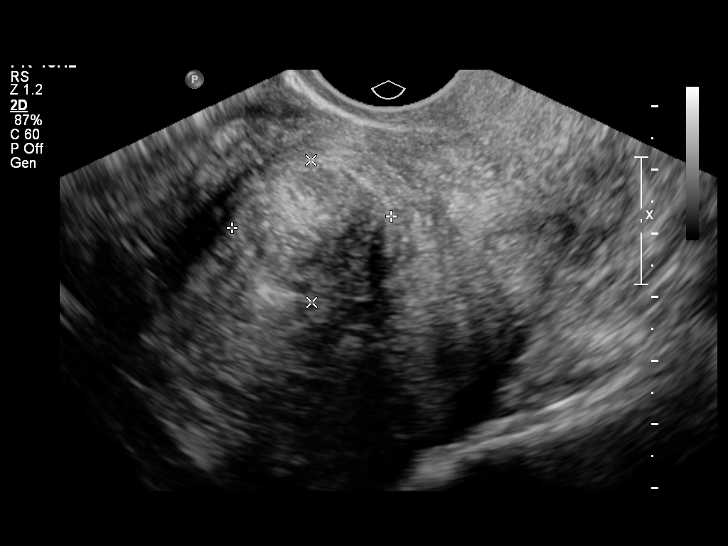
[im 43/64]
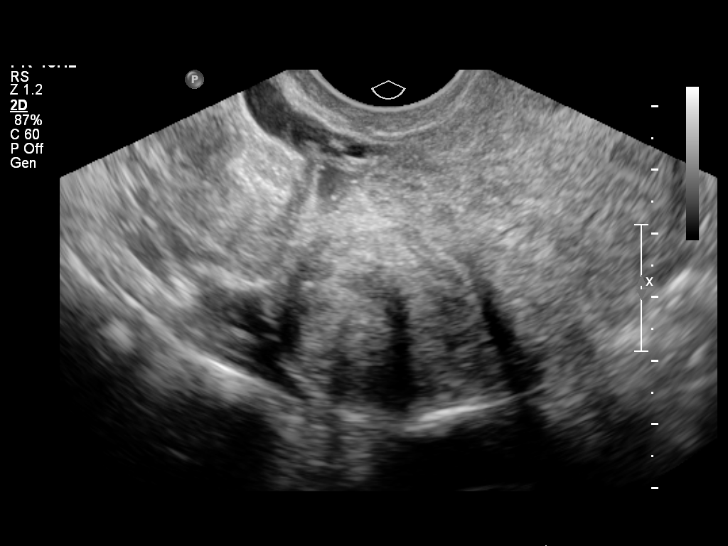
[im 47/64]
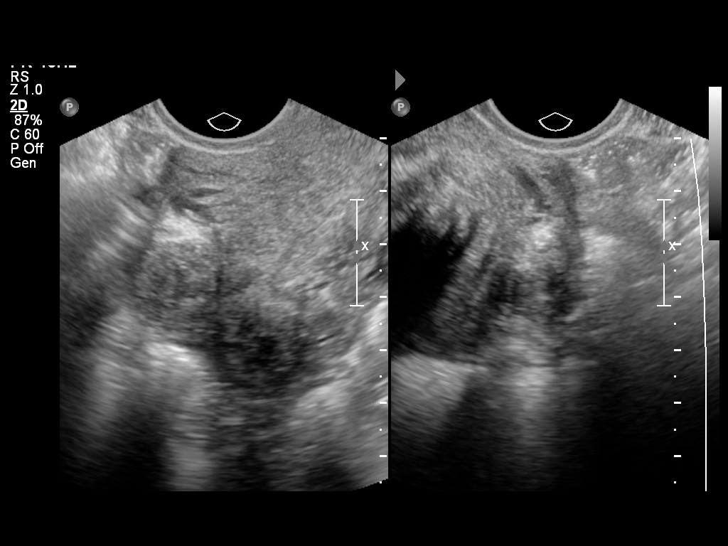
[im 52/64]
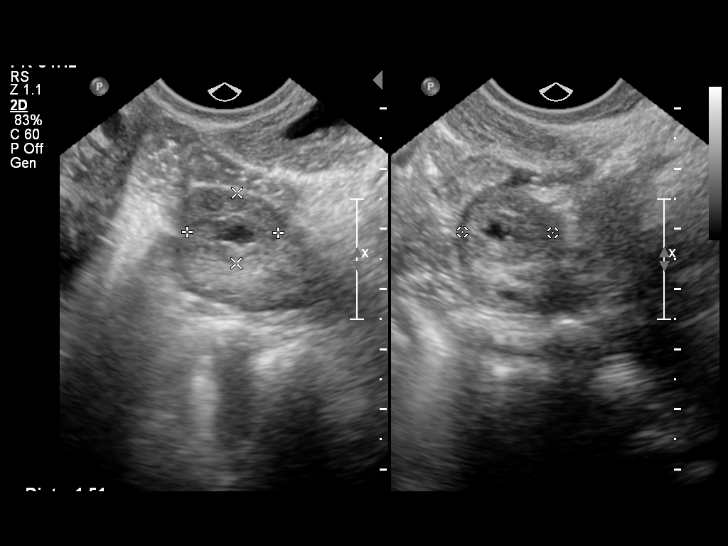
[im 57/64]
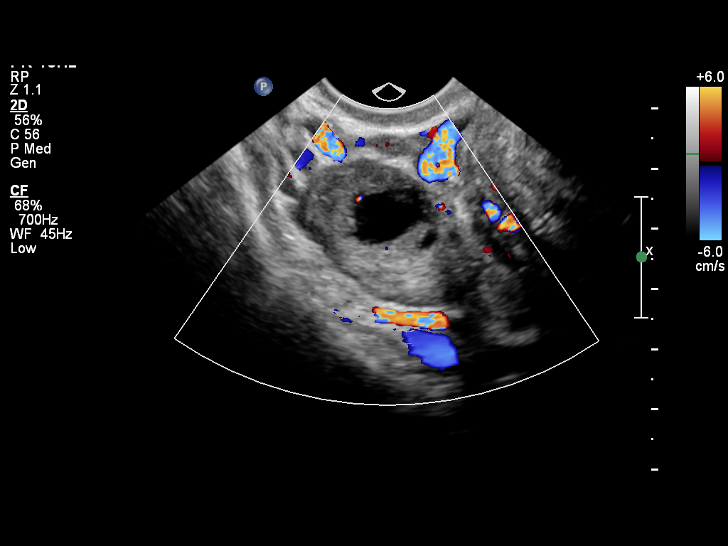
[im 61/64]
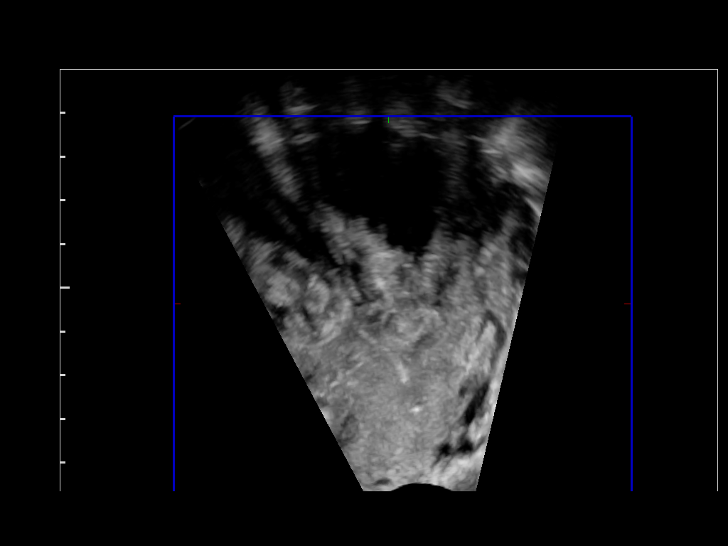

[13 of 28 positions shown; findings below may reference images not displayed]

FINDINGS: Uterus: The uterus is anteverted and measures 9.4 x 6.6 x 6.8 cm.
There are multiple uterine fibroids.  A submucosal fibroid in the
anterior upper uterine body measures 3.1 x 2.7 x 2.4 cm.  A
submucosal fibroid in the right upper uterine body measures 2.5 x
2.2 x 2.5 cm.  Intramural fibroid in the right uterine body
measures 2.8 x 3.0 x 3.3 cm.  Intramural right lower uterine
segment fibroid measures 1.3 x 1.2 x 1.7 cm.  Intramural left
fundal fibroid measures 1.5 x 1.2 x 1.4 cm.

Endometrium: Some portions of the the endometrium are obscured by
the fibroids.  Where visualized the endometrium measures 7 mm in
thickness.

Right ovary:  Normal appearance/no adnexal mass.  Measures 4.3 x
2.7 x 2.3 cm.

Left ovary: Normal appearance/no adnexal mass.  Measures 2.8 x
x 1.7 cm.

Other findings: No free fluid
IMPRESSION: 1.Enlarged fibroid uterus.  Fibroids are described above. Two of
the fibroids are submucosal in position.
2.  Ovaries within normal limits.

## 2014-03-30 ENCOUNTER — Encounter: Payer: Self-pay | Admitting: Obstetrics & Gynecology
